# Patient Record
Sex: Male | Born: 1960 | Race: White | Hispanic: No | Marital: Married
Health system: Southern US, Community
[De-identification: ages and names within clinical notes are randomized; demographics above are authoritative.]

---

## 2003-08-22 ENCOUNTER — Emergency Department (HOSPITAL_COMMUNITY): Admission: EM | Admit: 2003-08-22 | Discharge: 2003-08-22 | Payer: Self-pay | Admitting: Emergency Medicine

## 2003-10-28 ENCOUNTER — Emergency Department (HOSPITAL_COMMUNITY): Admission: EM | Admit: 2003-10-28 | Discharge: 2003-10-29 | Payer: Self-pay | Admitting: Emergency Medicine

## 2003-10-29 ENCOUNTER — Emergency Department (HOSPITAL_COMMUNITY): Admission: EM | Admit: 2003-10-29 | Discharge: 2003-10-31 | Payer: Self-pay | Admitting: Emergency Medicine

## 2003-11-03 ENCOUNTER — Emergency Department (HOSPITAL_COMMUNITY): Admission: EM | Admit: 2003-11-03 | Discharge: 2003-11-03 | Payer: Self-pay | Admitting: Emergency Medicine

## 2004-11-24 ENCOUNTER — Emergency Department (HOSPITAL_COMMUNITY): Admission: EM | Admit: 2004-11-24 | Discharge: 2004-11-24 | Payer: Self-pay | Admitting: Emergency Medicine

## 2007-01-25 IMAGING — CR DG ANKLE COMPLETE 3+V*L*
4 series · 4 of 4 positions shown · non-contrast
Comparison: none

HISTORY: Left ankle pain, fall

LEFT ANKLE 4 VIEWS:
No fracture, dislocation, or bone destruction.
Joint spaces preserved.  
Mineralization normal.

[view not recorded (1 of 4)]
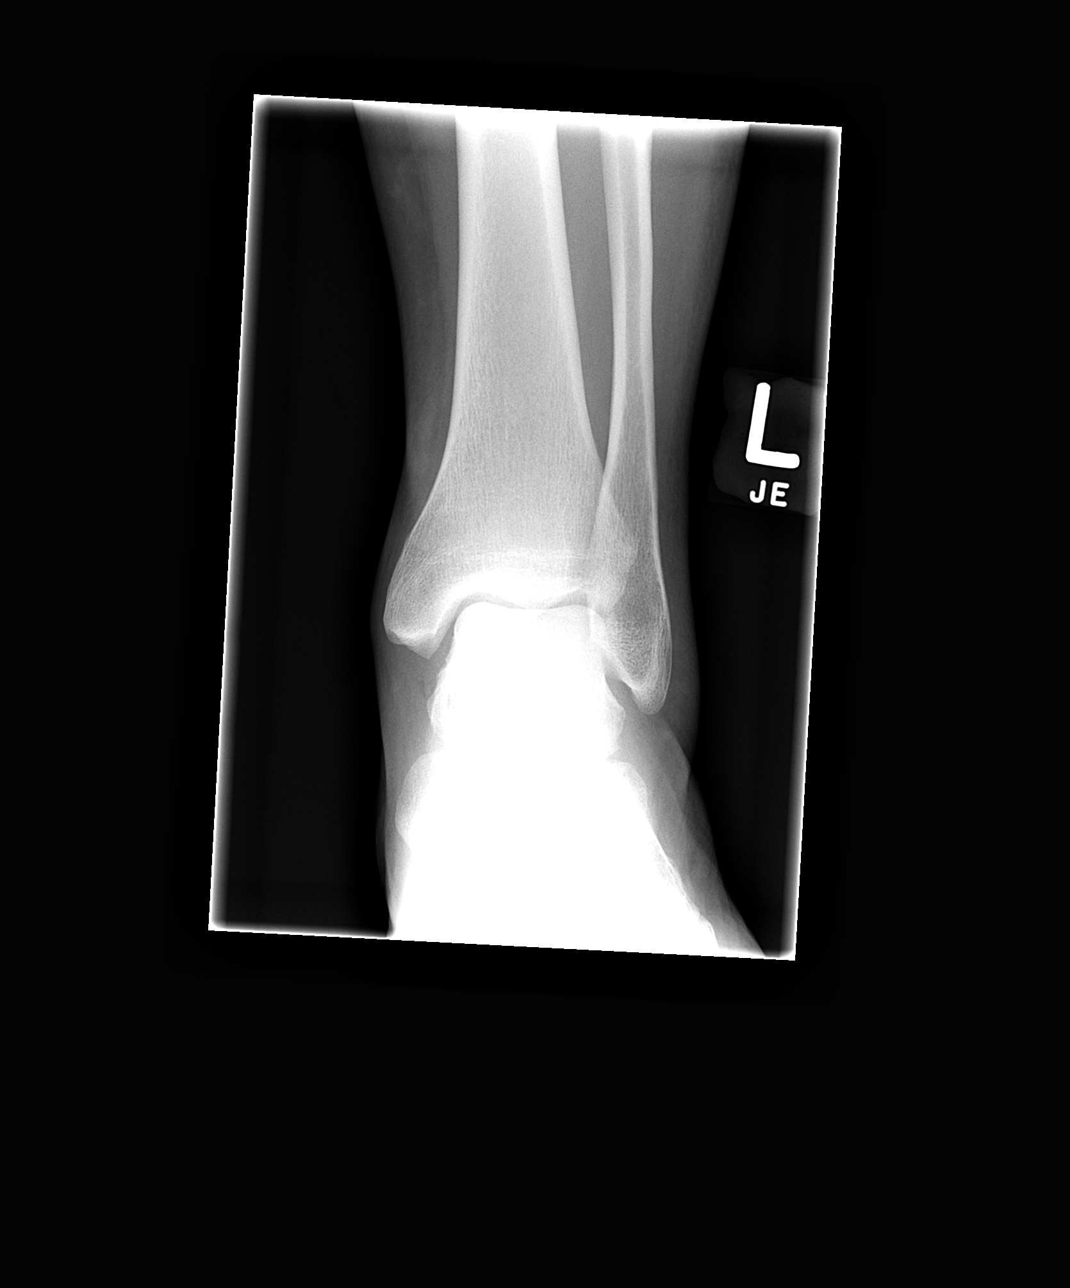

[view not recorded (2 of 4)]
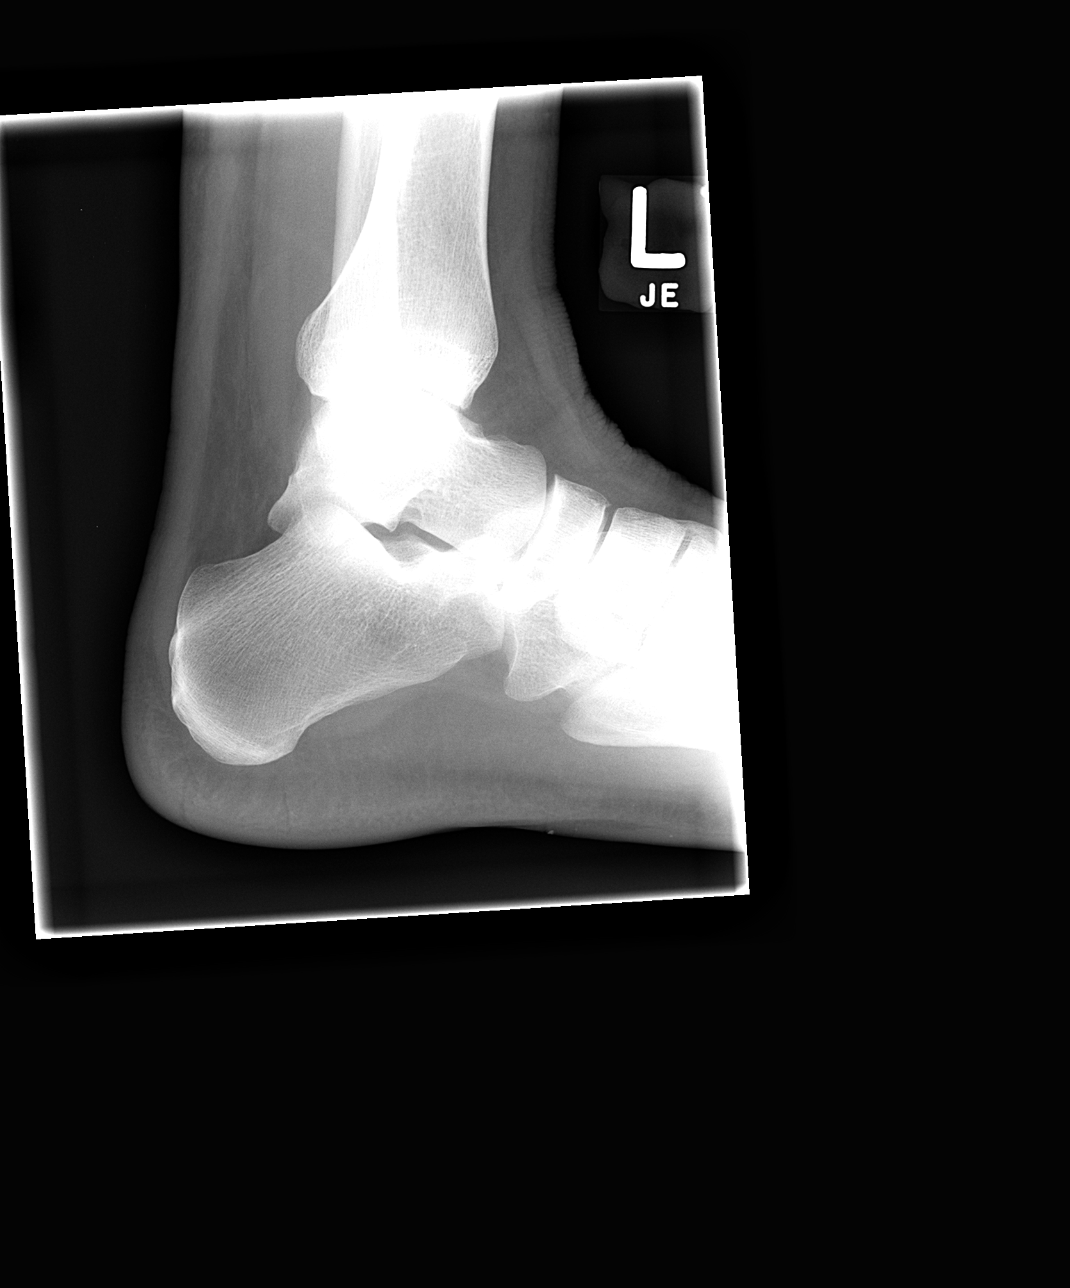

[view not recorded (3 of 4)]
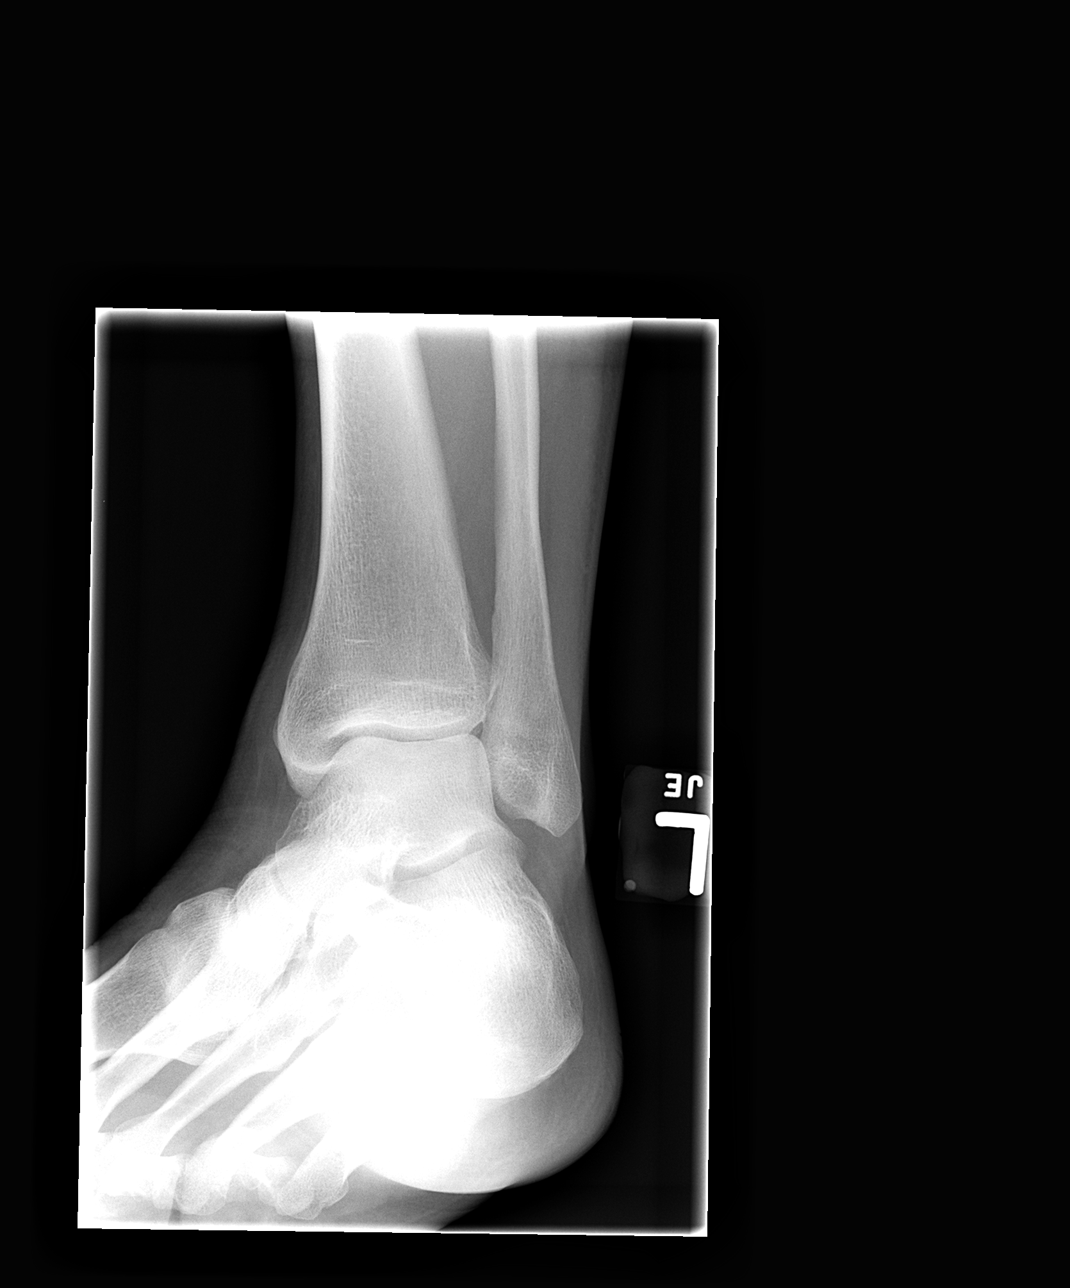

[view not recorded (4 of 4)]
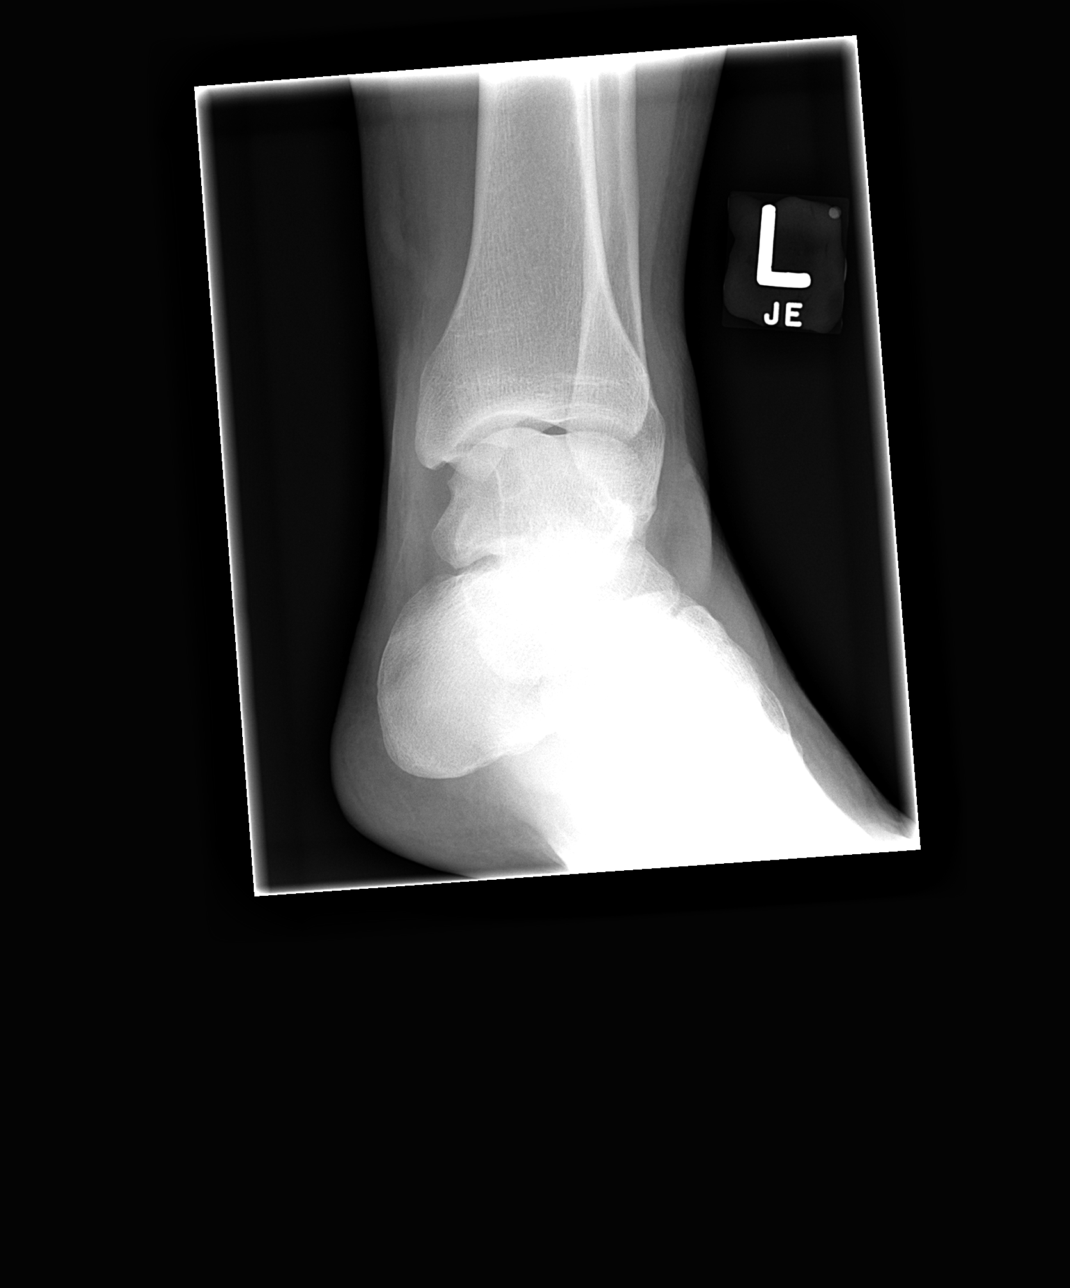

[4 of 4 positions shown; findings below may reference images not displayed]

IMPRESSION: No acute abnormalities.

## 2010-04-03 ENCOUNTER — Encounter: Payer: Self-pay | Admitting: Emergency Medicine

## 2016-03-15 ENCOUNTER — Encounter: Payer: Self-pay | Admitting: Sports Medicine

## 2016-03-15 ENCOUNTER — Ambulatory Visit (INDEPENDENT_AMBULATORY_CARE_PROVIDER_SITE_OTHER): Payer: Self-pay | Admitting: Sports Medicine

## 2016-03-15 DIAGNOSIS — M79672 Pain in left foot: Secondary | ICD-10-CM

## 2016-03-15 DIAGNOSIS — S92002A Unspecified fracture of left calcaneus, initial encounter for closed fracture: Secondary | ICD-10-CM

## 2016-03-15 NOTE — Progress Notes (Signed)
Subjective: Gerald Mcdonald is a 56 y.o. male patient who presents to office for evaluation of Left foot pain. Patient complains of pain especially over the last 2 weeks in the Left foot at the heel after fall from ladder on 02-29-16 and went to ER where diagnosed with fracture and told to follow up. Currently NWB with splint and rolling knee scooter. Patient denies any other pedal complaints.   There are no active problems to display for this patient.   No current outpatient prescriptions on file prior to visit.   No current facility-administered medications on file prior to visit.     Not on File  Objective:  General: Obese, Alert and oriented x3 in no acute distress  Dermatology: No open lesions bilateral lower extremities, no webspace macerations, + ecchymosis left heel, all nails x 10 are well manicured.  Vascular: Focal edema noted to left foot. Dorsalis Pedis and Posterior Tibial pedal pulses 1/4, Capillary Fill Time 3 seconds,(+) pedal hair growth bilateral, Temperature gradient within normal limits.  Neurology: Gross sensation intact via light touch bilateral, Protective sensation intact  with Semmes Weinstein Monofilament to all pedal sites, Deep tendon reflexes within normal limits bilateral, No babinski sign present bilateral. (- )Tinels sign left foot.   Musculoskeletal: There is tenderness with palpation at calcaneus on Left foot, No pain with calf compression bilateral. All joint range of motion is within normal limits except at left where there is guarding at fracture. Strength within normal limits in all groups bilateral.   Xray and CT reveals Sanders III B fracture    Assessment and Plan: Problem List Items Addressed This Visit    None    Visit Diagnoses    Closed nondisplaced fracture of left calcaneus, unspecified portion of calcaneus, initial encounter    -  Primary   Sanders III B   Pain of left heel           -Complete examination performed -Xrays and CT  reviewed -Discussed treatement options for fracture; risks, alternatives, and benefits explained. 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  Asencion Islamitorya Keaston Pile, DPM

## 2016-04-19 ENCOUNTER — Ambulatory Visit (INDEPENDENT_AMBULATORY_CARE_PROVIDER_SITE_OTHER): Payer: Self-pay | Admitting: Sports Medicine

## 2016-04-19 ENCOUNTER — Ambulatory Visit (INDEPENDENT_AMBULATORY_CARE_PROVIDER_SITE_OTHER): Payer: Self-pay

## 2016-04-19 ENCOUNTER — Encounter: Payer: Self-pay | Admitting: Sports Medicine

## 2016-04-19 DIAGNOSIS — S92002D Unspecified fracture of left calcaneus, subsequent encounter for fracture with routine healing: Secondary | ICD-10-CM

## 2016-04-19 DIAGNOSIS — M79672 Pain in left foot: Secondary | ICD-10-CM

## 2016-04-19 NOTE — Progress Notes (Signed)
Subjective: Gerald BlancoJohn R Mcdonald is a 56 y.o. male patient who returns to office for evaluation of Left foot pain secondary to calcaneal fracture, DOI 02-29-16. Currently NWB with splint and rolling knee scooter. Admits pain that's improving and decreased swelling and bruising. Patient denies any other pedal complaints.   There are no active problems to display for this patient.   Current Outpatient Prescriptions on File Prior to Visit  Medication Sig Dispense Refill  . oxyCODONE-acetaminophen (PERCOCET/ROXICET) 5-325 MG tablet Take 1 tablet by mouth every 6 (six) hours as needed. for pain  0   No current facility-administered medications on file prior to visit.     Not on File  Objective:  General: Obese, Alert and oriented x3 in no acute distress  Dermatology: No open lesions bilateral lower extremities, no webspace macerations, Decreased ecchymosis left heel, all nails x 10 are well manicured.  Vascular: Focal edema noted to left foot. Dorsalis Pedis and Posterior Tibial pedal pulses 1/4, Capillary Fill Time 3 seconds,(+) pedal hair growth bilateral, Temperature gradient within normal limits.  Neurology: Gross sensation intact via light touch bilateral, Protective sensation intact  with Semmes Weinstein Monofilament to all pedal sites, Deep tendon reflexes within normal limits bilateral, No babinski sign present bilateral. (- )Tinels sign left foot.   Musculoskeletal: There is tenderness with palpation at calcaneus on Left foot at the CC Joint, No pain with calf compression bilateral. All joint range of motion is within normal limits except at left where there is guarding at fracture. Strength within normal limits in all groups bilateral.   Xray left foot, reveals calcaneal fracture with mild comminution and mild collapse of posterior facet and calcaneal height without significant displacement, there is a small step off at the the cc joint, mild soft tissue swelling. No other acute findings.      Assessment and Plan: Problem List Items Addressed This Visit    None    Visit Diagnoses    Closed nondisplaced fracture of left calcaneus with routine healing, unspecified portion of calcaneus, subsequent encounter    -  Primary   Pain of left heel           -Complete examination performed -Xrays reviewed -Discussed treatement options for fracture and continued care; risks, alternatives, and benefits explained. 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 MUST REMAIN NWB and continue with knee scooter -Recommend protection, rest, ice, elevation daily until symptoms improve -Patient to return to office in 4-6 weeks for serial x-rays to assess healing or sooner if condition worsens. May consider bone stim at next visit.   Gerald Mcdonald, DPM

## 2016-04-21 ENCOUNTER — Ambulatory Visit: Payer: Self-pay | Admitting: Sports Medicine

## 2016-05-31 ENCOUNTER — Ambulatory Visit (INDEPENDENT_AMBULATORY_CARE_PROVIDER_SITE_OTHER): Payer: Self-pay | Admitting: Sports Medicine

## 2016-05-31 ENCOUNTER — Ambulatory Visit (INDEPENDENT_AMBULATORY_CARE_PROVIDER_SITE_OTHER): Payer: Self-pay

## 2016-05-31 ENCOUNTER — Encounter: Payer: Self-pay | Admitting: Sports Medicine

## 2016-05-31 DIAGNOSIS — M79672 Pain in left foot: Secondary | ICD-10-CM

## 2016-05-31 DIAGNOSIS — S92002K Unspecified fracture of left calcaneus, subsequent encounter for fracture with nonunion: Secondary | ICD-10-CM

## 2016-05-31 DIAGNOSIS — S92002D Unspecified fracture of left calcaneus, subsequent encounter for fracture with routine healing: Secondary | ICD-10-CM

## 2016-05-31 NOTE — Progress Notes (Signed)
Subjective: Gerald Mcdonald is a 56 y.o. male patient who returns to office for follow up evaluation of Left foot pain secondary to calcaneal fracture, DOI 02-29-16. Currently NWB with splint and rolling knee scooter. Admits very little pain. Patient denies any other pedal complaints.   There are no active problems to display for this patient.   Current Outpatient Prescriptions on File Prior to Visit  Medication Sig Dispense Refill  . oxyCODONE-acetaminophen (PERCOCET/ROXICET) 5-325 MG tablet Take 1 tablet by mouth every 6 (six) hours as needed. for pain  0   No current facility-administered medications on file prior to visit.     No Known Allergies  Objective:  General: Obese, Alert and oriented x3 in no acute distress  Dermatology: No open lesions bilateral lower extremities, no webspace macerations, Decreased ecchymosis left heel, all nails x 10 are well manicured.  Vascular: Focal edema noted to left foot. Dorsalis Pedis and Posterior Tibial pedal pulses 1/4, Capillary Fill Time 3 seconds,(+) pedal hair growth bilateral, Temperature gradient within normal limits.  Neurology: Gross sensation intact via light touch bilateral, Protective sensation intact with Semmes Weinstein Monofilament to all pedal sites, Deep tendon reflexes within normal limits bilateral, No babinski sign present bilateral. (- )Tinels sign left foot.   Musculoskeletal: There is decreasing tenderness with palpation at calcaneus on Left foot at the CC Joint, No pain with calf compression bilateral. All joint range of motion is within normal limits except at left where there is guarding at fracture. Strength within normal limits in all groups bilateral.   Xray left foot, reveals calcaneal fracture 1mm gap at body of calcaneus with mild comminution and mild collapse of posterior facet and calcaneal height without significant displacement, there is a small step off at the the cc joint, mild soft tissue swelling. No other  acute findings.     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 May toe touch down and use knee scooter for longer distances -Rx [employer] bone stim -Recommend protection, rest, ice, elevation daily until symptoms improve -Patient to return to office in 4 weeks for serial x-rays to assess healing or sooner if condition worsens.   Asencion Islamitorya Indie Nickerson, DPM

## 2016-06-05 ENCOUNTER — Telehealth: Payer: Self-pay | Admitting: *Deleted

## 2016-06-05 NOTE — Telephone Encounter (Addendum)
-----   Message from Asencion Islamitorya [place]over, [place] sent at 05/31/2016  3:57 PM EDT ----- Regarding: [employer] bone stim Left calcaneal fracture; patient is self pay  -Dr. Marylene LandStover. 06/05/2016-Faxed required form and clinicals and Demographics to [employer].

## 2016-06-28 ENCOUNTER — Ambulatory Visit (INDEPENDENT_AMBULATORY_CARE_PROVIDER_SITE_OTHER): Payer: Self-pay | Admitting: Sports Medicine

## 2016-06-28 ENCOUNTER — Ambulatory Visit (INDEPENDENT_AMBULATORY_CARE_PROVIDER_SITE_OTHER): Payer: Self-pay

## 2016-06-28 ENCOUNTER — Encounter: Payer: Self-pay | Admitting: Sports Medicine

## 2016-06-28 VITALS — BP 153/95 | HR 109 | Resp 18

## 2016-06-28 DIAGNOSIS — S92002K Unspecified fracture of left calcaneus, subsequent encounter for fracture with nonunion: Secondary | ICD-10-CM

## 2016-06-28 DIAGNOSIS — M79672 Pain in left foot: Secondary | ICD-10-CM

## 2016-06-28 NOTE — Progress Notes (Signed)
Subjective: Gerald Mcdonald is a 56 y.o. male patient who returns to office for follow up evaluation of Left foot pain secondary to calcaneal fracture, DOI 02-29-16. Currently partial weightbearing with CAM boot and cane. Admits very little pain. Started using bone stimulator 2 weeks ago. Patient denies any other pedal complaints.   There are no active problems to display for this patient.   Current Outpatient Prescriptions on File Prior to Visit  Medication Sig Dispense Refill  . oxyCODONE-acetaminophen (PERCOCET/ROXICET) 5-325 MG tablet Take 1 tablet by mouth every 6 (six) hours as needed. for pain  0   No current facility-administered medications on file prior to visit.     No Known Allergies  Objective:  General: Obese, Alert and oriented x3 in no acute distress  Dermatology: No open lesions bilateral lower extremities, no webspace macerations, Decreased ecchymosis left heel, all nails x 10 are well manicured.  Vascular: Mild focal edema noted to left foot. Dorsalis Pedis and Posterior Tibial pedal pulses 1/4, Capillary Fill Time 3 seconds,(+) pedal hair growth bilateral, Temperature gradient within normal limits.  Neurology: Gross sensation intact via light touch bilateral, Protective sensation intact with Semmes Weinstein Monofilament to all pedal sites, Deep tendon reflexes within normal limits bilateral, No babinski sign present bilateral. (- )Tinels sign left foot.   Musculoskeletal: There is decreasing tenderness with palpation at calcaneus on Left foot at the CC Joint, No pain with calf compression bilateral. All joint range of motion is within normal limits except at left where there is guarding at fracture. Strength within normal limits in all groups bilateral.   Xray left foot, reveals calcaneal fracture 1mm gap at body of calcaneus at the achilles insertion on lateral view 85% improved with mild comminution and mild collapse of posterior facet and calcaneal height without  significant displacement, there is a small step off at the the cc joint, mild soft tissue swelling. No other acute findings.     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 May toe touch down and use knee scooter for longer distances -Continue with [employer] bone stim -Recommend protection, rest, ice, elevation daily until symptoms improve -Patient to return to office in 4 weeks for serial x-rays to assess healing or sooner if condition worsens. Advised patient to use wheelchair on vacation and to take bone stimulator with him to continue treatments.   Asencion Islam, DPM

## 2016-07-26 ENCOUNTER — Ambulatory Visit (INDEPENDENT_AMBULATORY_CARE_PROVIDER_SITE_OTHER): Payer: Self-pay

## 2016-07-26 ENCOUNTER — Encounter: Payer: Self-pay | Admitting: Sports Medicine

## 2016-07-26 ENCOUNTER — Ambulatory Visit (INDEPENDENT_AMBULATORY_CARE_PROVIDER_SITE_OTHER): Payer: Self-pay | Admitting: Sports Medicine

## 2016-07-26 ENCOUNTER — Ambulatory Visit: Payer: Self-pay | Admitting: Sports Medicine

## 2016-07-26 DIAGNOSIS — M79672 Pain in left foot: Secondary | ICD-10-CM

## 2016-07-26 DIAGNOSIS — S92002K Unspecified fracture of left calcaneus, subsequent encounter for fracture with nonunion: Secondary | ICD-10-CM

## 2016-07-26 NOTE — Progress Notes (Signed)
Subjective: Vira BlancoJohn R Minckler is a 56 y.o. male patient who returns to office for follow up evaluation of Left foot pain secondary to calcaneal fracture, DOI 02-29-16. Currently partial weightbearing with CAM boot and cane. Admits very little pain. Started using bone stimulator 6 weeks ago. Patient denies any other pedal complaints.   There are no active problems to display for this patient.   Current Outpatient Prescriptions on File Prior to Visit  Medication Sig Dispense Refill  . oxyCODONE-acetaminophen (PERCOCET/ROXICET) 5-325 MG tablet Take 1 tablet by mouth every 6 (six) hours as needed. for pain  0   No current facility-administered medications on file prior to visit.     No Known Allergies  Objective:  General: Obese, Alert and oriented x3 in no acute distress  Dermatology: No open lesions bilateral lower extremities, no webspace macerations, Decreased ecchymosis left heel, all nails x 10 are well manicured.  Vascular: Mild focal edema noted to left foot. Dorsalis Pedis and Posterior Tibial pedal pulses 1/4, Capillary Fill Time 3 seconds,(+) pedal hair growth bilateral, Temperature gradient within normal limits.  Neurology: Gross sensation intact via light touch bilateral, Protective sensation intact with Semmes Weinstein Monofilament to all pedal sites, Deep tendon reflexes within normal limits bilateral, No babinski sign present bilateral. (- )Tinels sign left foot.   Musculoskeletal: There is decreasing tenderness with palpation at calcaneus on Left foot at the CC Joint, Mild anterior ankle pain likely from compensation at left ankle, No pain with calf compression bilateral. All joint range of motion is within normal limits except at left where there is guarding at fracture. Strength within normal limits in all groups bilateral.   Xray left foot, reveals calcaneal fracture 1mm gap at body of calcaneus at the achilles insertion on lateral view 95% improved with mild comminution and  mild collapse of posterior facet and calcaneal height without significant displacement, there is a small step off at the the cc joint, mild soft tissue swelling. No other acute findings.     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 Advised patient that we will likely d/c bone stim at next visit and allow him to wear other shoes.   Asencion Islamitorya Sueellen Kayes, DPM

## 2016-08-23 ENCOUNTER — Ambulatory Visit: Payer: Self-pay | Admitting: Sports Medicine
